# Patient Record
Sex: Female | Born: 2000 | Race: White | Hispanic: No | Marital: Single | Smoking: Never smoker
Health system: Southern US, Community
[De-identification: ages and names within clinical notes are randomized; demographics above are authoritative.]

## PROBLEM LIST (undated history)

## (undated) DIAGNOSIS — J302 Other seasonal allergic rhinitis: Secondary | ICD-10-CM

## (undated) DIAGNOSIS — I498 Other specified cardiac arrhythmias: Secondary | ICD-10-CM

## (undated) DIAGNOSIS — N200 Calculus of kidney: Secondary | ICD-10-CM

## (undated) DIAGNOSIS — G90A Postural orthostatic tachycardia syndrome (POTS): Secondary | ICD-10-CM

## (undated) HISTORY — DX: Other seasonal allergic rhinitis: J30.2

## (undated) HISTORY — PX: LITHOTRIPSY: SUR834

---
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
## 2019-10-03 ENCOUNTER — Other Ambulatory Visit: Payer: Self-pay

## 2019-10-03 ENCOUNTER — Ambulatory Visit: Payer: Self-pay | Attending: Internal Medicine

## 2019-10-03 DIAGNOSIS — Z23 Encounter for immunization: Secondary | ICD-10-CM

## 2019-10-03 NOTE — Progress Notes (Signed)
   Covid-19 Vaccination Clinic  Name:  Erica Sweeney    MRN: [id number] DOB: 2000-07-23  10/03/2019  Erica Sweeney was observed post Covid-19 immunization for 15 minutes without incident. She was provided with Vaccine Information Sheet and instruction to access the V-Safe system.   Erica Sweeney was instructed to call 911 with any severe reactions post vaccine: Marland Kitchen Difficulty breathing  . Swelling of face and throat  . A fast heartbeat  . A bad rash all over body  . Dizziness and weakness   Immunizations Administered    Name Date Dose VIS Date Route   Pfizer COVID-19 Vaccine 10/03/2019  4:54 PM 0.3 mL 06/17/2019 Intramuscular   Manufacturer: [employer], [employer]   Lot: [id number]   NDC: [address]-5

## 2019-10-17 ENCOUNTER — Ambulatory Visit
Admission: RE | Admit: 2019-10-17 | Discharge: 2019-10-17 | Disposition: A | Discharge status: Home / Self Care | Payer: 59 | Source: Ambulatory Visit | Attending: Family Medicine | Admitting: Family Medicine

## 2019-10-17 ENCOUNTER — Other Ambulatory Visit: Payer: Self-pay | Admitting: Family Medicine

## 2019-10-17 DIAGNOSIS — N218 Other lower urinary tract calculus: Secondary | ICD-10-CM

## 2019-10-17 DIAGNOSIS — R109 Unspecified abdominal pain: Secondary | ICD-10-CM

## 2019-10-24 ENCOUNTER — Ambulatory Visit: Payer: 59 | Attending: Internal Medicine

## 2019-10-24 DIAGNOSIS — Z23 Encounter for immunization: Secondary | ICD-10-CM

## 2019-10-24 NOTE — Progress Notes (Signed)
   Covid-19 Vaccination Clinic  Name:  Erica Sweeney    MRN: [id number] DOB: 25-Mar-2001  10/24/2019  [name] was observed post Covid-19 immunization for 30 minutes based on pre-vaccination screening without incident. She was provided with Vaccine Information Sheet and instruction to access the V-Safe system.   [name] was instructed to call 911 with any severe reactions post vaccine: Marland Kitchen Difficulty breathing  . Swelling of face and throat  . A fast heartbeat  . A bad rash all over body  . Dizziness and weakness   Immunizations Administered    Name Date Dose VIS Date Route   Pfizer COVID-19 Vaccine 10/24/2019  4:15 PM 0.3 mL 08/31/2018 Intramuscular   Manufacturer: [employer], [employer]   Lot: [id number]   NDC: [address]-8

## 2019-10-30 ENCOUNTER — Emergency Department
Admission: EM | Admit: 2019-10-30 | Discharge: 2019-10-30 | Disposition: A | Discharge status: Home / Self Care | Payer: 59 | Attending: Emergency Medicine | Admitting: Emergency Medicine

## 2019-10-30 ENCOUNTER — Emergency Department: Payer: 59

## 2019-10-30 ENCOUNTER — Encounter: Payer: Self-pay | Admitting: Emergency Medicine

## 2019-10-30 ENCOUNTER — Other Ambulatory Visit: Payer: Self-pay

## 2019-10-30 DIAGNOSIS — N200 Calculus of kidney: Secondary | ICD-10-CM | POA: Diagnosis not present

## 2019-10-30 DIAGNOSIS — R109 Unspecified abdominal pain: Secondary | ICD-10-CM | POA: Diagnosis present

## 2019-10-30 DIAGNOSIS — R1030 Lower abdominal pain, unspecified: Secondary | ICD-10-CM | POA: Diagnosis not present

## 2019-10-30 HISTORY — DX: Postural orthostatic tachycardia syndrome (POTS): G90.A

## 2019-10-30 HISTORY — DX: Calculus of kidney: N20.0

## 2019-10-30 HISTORY — DX: Other specified cardiac arrhythmias: I49.8

## 2019-10-30 LAB — CBC
HCT: 40.1 % (ref 36.0–46.0)
Hemoglobin: 13.9 g/dL (ref 12.0–15.0)
MCH: 31.7 pg (ref 26.0–34.0)
MCHC: 34.7 g/dL (ref 30.0–36.0)
MCV: 91.6 fL (ref 80.0–100.0)
Platelets: 300 10*3/uL (ref 150–400)
RBC: 4.38 MIL/uL (ref 3.87–5.11)
RDW: 11.9 % (ref 11.5–15.5)
WBC: 8.6 10*3/uL (ref 4.0–10.5)
nRBC: 0 % (ref 0.0–0.2)

## 2019-10-30 LAB — URINALYSIS, COMPLETE (UACMP) WITH MICROSCOPIC
Bacteria, UA: NONE SEEN
Bilirubin Urine: NEGATIVE
Glucose, UA: NEGATIVE mg/dL
Ketones, ur: NEGATIVE mg/dL
Nitrite: NEGATIVE
Protein, ur: NEGATIVE mg/dL
RBC / HPF: >50 RBC/hpf (ref 0–5)
Specific Gravity, Urine: 1.02 (ref 1.005–1.030)
pH: 7 (ref 5.0–8.0)

## 2019-10-30 LAB — COMPREHENSIVE METABOLIC PANEL
ALT: 19 U/L (ref 0–44)
AST: 22 U/L (ref 15–41)
Albumin: 4.5 g/dL (ref 3.5–5.0)
Alkaline Phosphatase: 59 U/L (ref 38–126)
Anion gap: 9 (ref 5–15)
BUN: 11 mg/dL (ref 6–20)
CO2: 25 mmol/L (ref 22–32)
Calcium: 9.5 mg/dL (ref 8.9–10.3)
Chloride: 103 mmol/L (ref 98–111)
Creatinine, Ser: 0.71 mg/dL (ref 0.44–1.00)
GFR calc Af Amer: >60 mL/min (ref >60)
GFR calc non Af Amer: >60 mL/min (ref >60)
Glucose, Bld: 98 mg/dL (ref 70–99)
Potassium: 3.8 mmol/L (ref 3.5–5.1)
Sodium: 137 mmol/L (ref 135–145)
Total Bilirubin: 0.7 mg/dL (ref 0.3–1.2)
Total Protein: 8 g/dL (ref 6.5–8.1)

## 2019-10-30 LAB — WET PREP, GENITAL
Clue Cells Wet Prep HPF POC: NONE SEEN
Sperm: NONE SEEN
Trich, Wet Prep: NONE SEEN
Yeast Wet Prep HPF POC: NONE SEEN

## 2019-10-30 LAB — CHLAMYDIA/NGC RT PCR (ARMC ONLY)
Chlamydia Tr: NOT DETECTED
N gonorrhoeae: NOT DETECTED

## 2019-10-30 LAB — LIPASE, BLOOD: Lipase: 21 U/L (ref 11–51)

## 2019-10-30 LAB — POCT PREGNANCY, URINE: Preg Test, Ur: NEGATIVE

## 2019-10-30 NOTE — ED Triage Notes (Signed)
Pt here for abdominal pain.  Pain moves all over stomach and has been present X 1 month. Hx of kidney stones; had negative KUB through [facility]. + diarrhea and nausea.  Reports hematuria.  Given abx for UTI from UC 5 days ago but not getting better. Today pain is both flank and radiates around to abdomen.

## 2019-10-30 NOTE — Discharge Instructions (Addendum)
Follow-up with your regular doctor if not improving in 3 days.  Return the emergency department if worsening.  Take Tylenol and ibuprofen for pain as needed.  Drink plenty of water.  If your STD test is positive you will need treatment.  You may either return here, go to the [facility], or go to the [facility] health department.

## 2019-10-30 NOTE — ED Provider Notes (Signed)
[facility] Emergency Department Provider Note  ____________________________________________   First MD Initiated Contact with Patient 10/30/19 1704     (approximate)  I have reviewed the triage vital signs and the nursing notes.   HISTORY  Chief Complaint Abdominal Pain and Flank Pain    HPI Erica Sweeney is a 19 y.o. female presents emergency department complaining abdominal pain on and off for 1 month.  History of kidney stones.  Had a negative KUB [place], she has had some diarrhea and nausea.  Some hematuria but is also currently on her period, she was given a antibiotic from the urgent care 5 days ago but feels like she is not getting better.  Flank pain is radiating around to the abdomen.  Also the patient had sex for the first time a few days ago.  She continues to have some burning with urination and itching in the vaginal area.  No vaginal discharge.  She states he did use a condom.    Past Medical History:  Diagnosis Date   Kidney stone    POTS (postural orthostatic tachycardia syndrome)     There are no problems to display for this patient.   Past Surgical History:  Procedure Laterality Date   LITHOTRIPSY      Prior to Admission medications   Not on File    Allergies Morphine and related  History reviewed. No pertinent family history.  Social History Social History   Tobacco Use   Smoking status: Never Smoker   Smokeless tobacco: Never Used  Substance Use Topics   Alcohol use: Yes   Drug use: Never    Review of Systems  Constitutional: No fever/chills Eyes: No visual changes. ENT: No sore throat. Respiratory: Denies cough Cardiovascular: Denies chest pain Gastrointestinal: Positive abdominal pain Genitourinary: Positive for dysuria. Musculoskeletal: Negative for back pain. Skin: Negative for rash. Psychiatric: no mood changes,     ____________________________________________   PHYSICAL EXAM:  VITAL  SIGNS: ED Triage Vitals  Enc Vitals Group     BP 10/30/19 1620 119/69     Pulse Rate 10/30/19 1620 85     Resp 10/30/19 1620 14     Temp 10/30/19 1620 98.6 F (37 C)     Temp Source 10/30/19 1620 Oral     SpO2 10/30/19 1620 99 %     Weight 10/30/19 1615 125 lb (56.7 kg)     Height 10/30/19 1615 5\' 6"  (1.676 m)     Head Circumference --      Peak Flow --      Pain Score --      Pain Loc --      Pain Edu? --      Excl. in [place]? --     Constitutional: Alert and oriented. Well appearing and in no acute distress. Eyes: Conjunctivae are normal.  Head: Atraumatic. Nose: No congestion/rhinnorhea. Mouth/Throat: Mucous membranes are moist.   Neck:  supple no lymphadenopathy noted Cardiovascular: Normal rate, regular rhythm. Heart sounds are normal Respiratory: Normal respiratory effort.  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  Skin:  Skin is warm, dry and intact. No rash noted.  Psychiatric: Mood and affect are normal. Speech and behavior are normal.  ____________________________________________   LABS (all labs ordered are listed, but only abnormal results are displayed)  Labs Reviewed  WET PREP, GENITAL - Abnormal; Notable for the following components:      Result Value   WBC, Wet Prep HPF POC FEW (*)    All other components within normal limits  URINALYSIS, COMPLETE (UACMP) WITH MICROSCOPIC - Abnormal; Notable for the following components:   Color, Urine AMBER (*)    APPearance HAZY (*)    Hgb urine dipstick LARGE (*)    Leukocytes,Ua TRACE (*)    All other components within normal limits  CHLAMYDIA/NGC RT PCR ([facility]  ONLY)  URINE CULTURE  LIPASE, BLOOD  COMPREHENSIVE METABOLIC PANEL  CBC  POC URINE PREG, ED  POCT PREGNANCY, URINE   ____________________________________________   ____________________________________________  RADIOLOGY  KUB shows a kidney stone at the right renal silhouette  ____________________________________________   PROCEDURES  Procedure(s) performed: No  Procedures    ____________________________________________   INITIAL IMPRESSION / ASSESSMENT AND PLAN / ED COURSE  Pertinent labs & imaging results that were available during my care of the patient were reviewed by me and considered in my medical decision making (see chart for details).   Patient is 19 year old female presents emergency department with right-sided flank pain.  See HPI  Physical exam shows patient to appear well.  Abdomen is nontender.  Pelvic exam shows some bruising in the vaginal vault, patient is currently on her period, no vaginal discharge noted.  DDx: Kidney stone, appendicitis, UTI, STI  CBC is normal, comprehensive metabolic panel is normal, lipase normal, urinalysis has large amount of hemoglobin with trace of leuks, 11-20 WBCs greater than 50 RBCs but.  Patient is currently on her cycle.  POC pregnancy is negative, GC/chlamydia is negative, wet prep has a few WBCs, I did order a urine culture due to the recent antibiotic use along with continued dysuria  I feel that all the labs are reassuring.  The patient is not tender in the right lower quadrant for concerns of appendicitis.  KUB does show a kidney stone along the renal silhouette.  I feel the patient has more anxiety towards her abdominal pain at this time.  Most likely due to her first sexual experience.  I did have a long discussion with her about the use of condoms.  I feel that she should follow-up with a GYN doctor for a pelvic when she is not on her cycle.  She states she understands and will comply.  She is to return  the emergency department if worsening.  She was discharged in stable condition.    Erica Sweeney was evaluated in Emergency Department on 10/30/2019 for the symptoms described in the history of present illness. She was evaluated in the context of the global COVID-19 pandemic, which necessitated consideration that the patient might be at risk for infection with the SARS-CoV-2 virus that causes COVID-19. Institutional protocols and algorithms that pertain to the evaluation of patients at risk for COVID-19 are in a state of rapid change based on information released by regulatory bodies including the CDC and federal and state organizations. These policies and algorithms were followed during the patient's care in the ED.   As part of my medical decision making, I reviewed the following data within the electronic MEDICAL RECORD NUMBER Nursing notes reviewed and incorporated, Labs reviewed , Old chart reviewed, Radiograph reviewed , Notes from prior ED visits and Impact  Controlled Substance Database  ____________________________________________   FINAL CLINICAL IMPRESSION(S) / ED DIAGNOSES  Final diagnoses:  Kidney stone on right side  Lower abdominal pain      NEW MEDICATIONS STARTED DURING THIS VISIT:  New Prescriptions   No medications on file     Note:  This document was prepared using Dragon voice recognition software and may include unintentional dictation errors.    Versie Starks, PA-C 10/30/19 2003    Earleen Newport, MD 10/30/19 2157

## 2019-10-31 LAB — URINE CULTURE: Culture: <10000 — AB

## 2020-09-29 ENCOUNTER — Emergency Department
Admission: EM | Admit: 2020-09-29 | Discharge: 2020-09-29 | Disposition: A | Discharge status: Home / Self Care | Payer: 59 | Attending: Emergency Medicine | Admitting: Emergency Medicine

## 2020-09-29 ENCOUNTER — Other Ambulatory Visit: Payer: Self-pay

## 2020-09-29 DIAGNOSIS — L03115 Cellulitis of right lower limb: Secondary | ICD-10-CM

## 2020-09-29 DIAGNOSIS — X58XXXA Exposure to other specified factors, initial encounter: Secondary | ICD-10-CM | POA: Insufficient documentation

## 2020-09-29 DIAGNOSIS — S8991XA Unspecified injury of right lower leg, initial encounter: Secondary | ICD-10-CM | POA: Diagnosis present

## 2020-09-29 DIAGNOSIS — S80821A Blister (nonthermal), right lower leg, initial encounter: Secondary | ICD-10-CM | POA: Insufficient documentation

## 2020-09-29 DIAGNOSIS — T148XXA Other injury of unspecified body region, initial encounter: Secondary | ICD-10-CM

## 2020-09-29 MED ORDER — BACITRACIN-NEOMYCIN-POLYMYXIN 400-5-5000 EX OINT
TOPICAL_OINTMENT | Freq: Once | CUTANEOUS | Status: AC
  Administered 2020-09-29: 1 via TOPICAL
  Filled 2020-09-29: qty 1

## 2020-09-29 MED ORDER — CEPHALEXIN 500 MG PO CAPS: 500.0000 mg | ORAL_CAPSULE | Freq: Three times a day (TID) | ORAL | 0 refills | Status: AC

## 2020-09-29 NOTE — ED Triage Notes (Signed)
First Nurse Note:  patient had blister to heel yesterday.  Today awoke with area reddened and ankle swelling noted.  AAOx3.  Skin warm and dry. NAD. Ambulates with easy and steady gait.

## 2020-09-29 NOTE — ED Provider Notes (Signed)
[facility] Emergency Department Provider Note  ____________________________________________   Event Date/Time   First MD Initiated Contact with Patient 09/29/20 1440     (approximate)  I have reviewed the triage vital signs and the nursing notes.   HISTORY  Chief Complaint Foot Pain    HPI Erica Sweeney is a 20 y.o. female presents emergency department complaining of a blister on the back of the heel that has become much larger and has redness surrounding the area.  Patient states that she gets blisters from running but this is much worse than her normal.  She had called her mother and her mother was concerned due to the appearance of the blister.  No fever or chills.  No drainage from the area.    Past Medical History:  Diagnosis Date  . Kidney stone   . POTS (postural orthostatic tachycardia syndrome)     There are no problems to display for this patient.   Past Surgical History:  Procedure Laterality Date  . LITHOTRIPSY      Prior to Admission medications   Medication Sig Start Date End Date Taking? Authorizing Provider  cephALEXin (KEFLEX) 500 MG capsule Take 1 capsule (500 mg total) by mouth 3 (three) times daily for 7 days. 09/29/20 10/06/20 Yes Fisher, Roselyn Bering, PA-C    Allergies Morphine and related  History reviewed. No pertinent family history.  Social History Social History   Tobacco Use  . Smoking status: Never Smoker  . Smokeless tobacco: Never Used  Substance Use Topics  . Alcohol use: Yes  . Drug use: Never    Review of Systems  Constitutional: No fever/chills Eyes: No visual changes. ENT: No sore throat. Respiratory: Denies cough Genitourinary: Negative for dysuria. Musculoskeletal: Negative for back pain.  Positive for blister on the right heel Skin: Negative for rash. Psychiatric: no mood changes,     ____________________________________________   PHYSICAL EXAM:  VITAL SIGNS: ED Triage Vitals  Enc  Vitals Group     BP 09/29/20 1354 122/80     Pulse Rate 09/29/20 1354 92     Resp 09/29/20 1354 16     Temp 09/29/20 1354 98.2 F (36.8 C)     Temp Source 09/29/20 1354 Oral     SpO2 09/29/20 1354 100 %     Weight 09/29/20 1352 125 lb (56.7 kg)     Height 09/29/20 1352 5\' 6"  (1.676 m)     Head Circumference --      Peak Flow --      Pain Score 09/29/20 1351 6     Pain Loc --      Pain Edu? --      Excl. in GC? --     Constitutional: Alert and oriented. Well appearing and in no acute distress. Eyes: Conjunctivae are normal.  Head: Atraumatic. Nose: No congestion/rhinnorhea. Mouth/Throat: Mucous membranes are moist.   Neck:  supple no lymphadenopathy noted Cardiovascular: Normal rate, regular rhythm.  Respiratory: Normal respiratory effort.  No retractions, GU: deferred Musculoskeletal: FROM all extremities, warm and well perfused, skin on achilles with large blister, swelling, and redness extending out approx by 1 inch Neurologic:  Normal speech and language.  Skin:  Skin is warm, dry and intact.  Psychiatric: Mood and affect are normal. Speech and behavior are normal.  ____________________________________________   LABS (all labs ordered are listed, but only abnormal results are displayed)  Labs Reviewed - No data to display ____________________________________________   ____________________________________________  RADIOLOGY  ____________________________________________   PROCEDURES  Procedure(s) performed: No  Procedures    ____________________________________________   INITIAL IMPRESSION / ASSESSMENT AND PLAN / ED COURSE  Pertinent labs & imaging results that were available during my care of the patient were reviewed by me and considered in my medical decision making (see chart for details).   Patient is 20 year old female presents with blister to the right ankle.  See HPI.  Physical exam shows patient appear well.  In discussion with the  patient with concerns of infection that close to the Achilles, placed her on antibiotic.  She is to leave the blister intact.  She is not to wear any shoes that have enclosed heels.  Follow-up with your regular doctor in the acute care if not improving to 3 days.  Return emergency department worsening.  She is discharged stable condition.     Erica Sweeney was evaluated in Emergency Department on 09/29/2020 for the symptoms described in the history of present illness. She was evaluated in the context of the global COVID-19 pandemic, which necessitated consideration that the patient might be at risk for infection with the SARS-CoV-2 virus that causes COVID-19. Institutional protocols and algorithms that pertain to the evaluation of patients at risk for COVID-19 are in a state of rapid change based on information released by regulatory bodies including the CDC and federal and state organizations. These policies and algorithms were followed during the patient's care in the ED.    As part of my medical decision making, I reviewed the following data within the electronic MEDICAL RECORD NUMBER Nursing notes reviewed and incorporated, Old chart reviewed, Notes from prior ED visits and [place] Controlled Substance Database  ____________________________________________   FINAL CLINICAL IMPRESSION(S) / ED DIAGNOSES  Final diagnoses:  Blister  Cellulitis of right lower extremity      NEW MEDICATIONS STARTED DURING THIS VISIT:  Discharge Medication List as of 09/29/2020  3:04 PM    START taking these medications   Details  cephALEXin (KEFLEX) 500 MG capsule Take 1 capsule (500 mg total) by mouth 3 (three) times daily for 7 days., Starting Sat 09/29/2020, Until Sat 10/06/2020, Normal         Note:  This document was prepared using Dragon voice recognition software and may include unintentional dictation errors.    Faythe Ghee, PA-C 09/29/20 1624    Sharyn Creamer, MD 09/29/20 [phone]

## 2020-09-29 NOTE — Discharge Instructions (Addendum)
Wear open heeled shoes.  Do not have anything that would rub against the blister.  Soak the foot in warm water with Epson salts at least once daily for about 10 minutes. Do not pop the blister.  This is a natural bandage.  Return if worsening

## 2020-09-29 NOTE — ED Triage Notes (Signed)
Pt has white area on back of heel surrounded by some redness and pain that started as a blister.

## 2021-07-02 DIAGNOSIS — G43719 Chronic migraine without aura, intractable, without status migrainosus: Secondary | ICD-10-CM | POA: Insufficient documentation

## 2021-07-10 ENCOUNTER — Other Ambulatory Visit: Payer: Self-pay

## 2021-07-10 ENCOUNTER — Ambulatory Visit
Admission: RE | Admit: 2021-07-10 | Discharge: 2021-07-10 | Disposition: A | Discharge status: Home / Self Care | Payer: 59 | Source: Ambulatory Visit | Attending: Orthopedic Surgery | Admitting: Orthopedic Surgery

## 2021-07-10 ENCOUNTER — Other Ambulatory Visit: Payer: Self-pay | Admitting: Orthopedic Surgery

## 2021-07-10 ENCOUNTER — Ambulatory Visit
Admission: RE | Admit: 2021-07-10 | Discharge: 2021-07-10 | Disposition: A | Discharge status: Home / Self Care | Payer: 59 | Attending: Orthopedic Surgery | Admitting: Orthopedic Surgery

## 2021-07-10 DIAGNOSIS — R52 Pain, unspecified: Secondary | ICD-10-CM | POA: Insufficient documentation

## 2021-07-30 DIAGNOSIS — J45909 Unspecified asthma, uncomplicated: Secondary | ICD-10-CM | POA: Insufficient documentation

## 2021-07-30 DIAGNOSIS — E282 Polycystic ovarian syndrome: Secondary | ICD-10-CM | POA: Insufficient documentation

## 2021-07-30 DIAGNOSIS — G90A Postural orthostatic tachycardia syndrome (POTS): Secondary | ICD-10-CM | POA: Insufficient documentation

## 2021-07-30 DIAGNOSIS — N200 Calculus of kidney: Secondary | ICD-10-CM | POA: Insufficient documentation

## 2021-07-30 DIAGNOSIS — I7300 Raynaud's syndrome without gangrene: Secondary | ICD-10-CM | POA: Insufficient documentation

## 2021-08-13 ENCOUNTER — Other Ambulatory Visit: Payer: Self-pay

## 2021-08-13 ENCOUNTER — Emergency Department
Admission: EM | Admit: 2021-08-13 | Discharge: 2021-08-13 | Disposition: A | Discharge status: Home / Self Care | Payer: 59 | Attending: Emergency Medicine | Admitting: Emergency Medicine

## 2021-08-13 DIAGNOSIS — R112 Nausea with vomiting, unspecified: Secondary | ICD-10-CM | POA: Insufficient documentation

## 2021-08-13 DIAGNOSIS — R1012 Left upper quadrant pain: Secondary | ICD-10-CM | POA: Diagnosis not present

## 2021-08-13 DIAGNOSIS — R109 Unspecified abdominal pain: Secondary | ICD-10-CM | POA: Diagnosis present

## 2021-08-13 LAB — URINALYSIS, ROUTINE W REFLEX MICROSCOPIC
Bilirubin Urine: NEGATIVE
Glucose, UA: NEGATIVE mg/dL
Ketones, ur: NEGATIVE mg/dL
Nitrite: NEGATIVE
Protein, ur: NEGATIVE mg/dL
Specific Gravity, Urine: 1.017 (ref 1.005–1.030)
pH: 5 (ref 5.0–8.0)

## 2021-08-13 LAB — COMPREHENSIVE METABOLIC PANEL
ALT: 18 U/L (ref 0–44)
AST: 23 U/L (ref 15–41)
Albumin: 4 g/dL (ref 3.5–5.0)
Alkaline Phosphatase: 65 U/L (ref 38–126)
Anion gap: 7 (ref 5–15)
BUN: 13 mg/dL (ref 6–20)
CO2: 25 mmol/L (ref 22–32)
Calcium: 9.5 mg/dL (ref 8.9–10.3)
Chloride: 103 mmol/L (ref 98–111)
Creatinine, Ser: 0.72 mg/dL (ref 0.44–1.00)
GFR, Estimated: >60 mL/min (ref >60)
Glucose, Bld: 106 mg/dL — ABNORMAL HIGH (ref 70–99)
Potassium: 3.2 mmol/L — ABNORMAL LOW (ref 3.5–5.1)
Sodium: 135 mmol/L (ref 135–145)
Total Bilirubin: 0.9 mg/dL (ref 0.3–1.2)
Total Protein: 7.3 g/dL (ref 6.5–8.1)

## 2021-08-13 LAB — CBC
HCT: 44.9 % (ref 36.0–46.0)
Hemoglobin: 14.9 g/dL (ref 12.0–15.0)
MCH: 31 pg (ref 26.0–34.0)
MCHC: 33.2 g/dL (ref 30.0–36.0)
MCV: 93.3 fL (ref 80.0–100.0)
Platelets: 304 10*3/uL (ref 150–400)
RBC: 4.81 MIL/uL (ref 3.87–5.11)
RDW: 12.2 % (ref 11.5–15.5)
WBC: 6.3 10*3/uL (ref 4.0–10.5)
nRBC: 0 % (ref 0.0–0.2)

## 2021-08-13 LAB — POC URINE PREG, ED: Preg Test, Ur: NEGATIVE

## 2021-08-13 LAB — LIPASE, BLOOD: Lipase: 33 U/L (ref 11–51)

## 2021-08-13 MED ORDER — ONDANSETRON 8 MG PO TBDP
8.0000 mg | ORAL_TABLET | Freq: Once | ORAL | Status: AC
  Administered 2021-08-13: 8 mg via ORAL
  Filled 2021-08-13: qty 1

## 2021-08-13 MED ORDER — ONDANSETRON 4 MG PO TBDP: 4.0000 mg | ORAL_TABLET | Freq: Three times a day (TID) | ORAL | 0 refills | Status: DC | PRN

## 2021-08-13 MED ORDER — FAMOTIDINE 20 MG PO TABS
40.0000 mg | ORAL_TABLET | Freq: Once | ORAL | Status: AC
  Administered 2021-08-13: 40 mg via ORAL
  Filled 2021-08-13: qty 2

## 2021-08-13 MED ORDER — ALUMINUM-MAGNESIUM-SIMETHICONE 200-200-20 MG/5ML PO SUSP: 30.0000 mL | Freq: Three times a day (TID) | ORAL | 0 refills | Status: DC

## 2021-08-13 MED ORDER — ALUM & MAG HYDROXIDE-SIMETH 200-200-20 MG/5ML PO SUSP
30.0000 mL | Freq: Once | ORAL | Status: AC
  Administered 2021-08-13: 30 mL via ORAL
  Filled 2021-08-13: qty 30

## 2021-08-13 MED ORDER — FAMOTIDINE 20 MG PO TABS: 20.0000 mg | ORAL_TABLET | Freq: Two times a day (BID) | ORAL | 0 refills | Status: DC

## 2021-08-13 NOTE — ED Triage Notes (Signed)
Pt has abd pain with n/v.  Hx kidney stones.  Pt reports pain above navel and right side of abd.  No vag bleeding.  No dysuria.  Pt alert  speech clear.

## 2021-08-13 NOTE — ED Provider Notes (Signed)
[facility] Provider Note    Event Date/Time   First MD Initiated Contact with Patient 08/13/21 2133     (approximate)   History   Flank Pain and Abdominal Pain   HPI  Jennesis Kohlmann is a 21 y.o. female with a past history of kidney stones and ovarian cyst who comes the ED complaining of abdominal pain with nausea and vomiting that started yesterday evening, gradual onset.  Today with eating she had stomach upset, and tonight after eating a slice of pizza she had vomiting.  No constipation diarrhea or fever.  Pain is moderate intensity.     Physical Exam   Triage Vital Signs: ED Triage Vitals  Enc Vitals Group     BP 08/13/21 2001 117/81     Pulse Rate 08/13/21 2001 (!) 112     Resp 08/13/21 2001 20     Temp 08/13/21 2001 98.5 F (36.9 C)     Temp Source 08/13/21 2001 Oral     SpO2 08/13/21 2001 96 %     Weight 08/13/21 2002 125 lb (56.7 kg)     Height 08/13/21 2002 5\' 6"  (1.676 m)     Head Circumference --      Peak Flow --      Pain Score 08/13/21 2002 6     Pain Loc --      Pain Edu? --      Excl. in [place]? --     Most recent vital signs: Vitals:   08/13/21 2001  BP: 117/81  Pulse: (!) 112  Resp: 20  Temp: 98.5 F (36.9 C)  SpO2: 96%     General: Awake, no distress.  CV:  Good peripheral perfusion.  Resp:  Normal effort.  Abd:  No distention.  Soft, no peritoneal signs.  There is left upper quadrant  tenderness.  No right-sided tenderness, negative Murphy sign.  No right lower quadrant tenderness or tenderness at McBurney's point.  No CVA tenderness Other:  No rash.  Moist mucosa   ED Results / Procedures / Treatments   Labs (all labs ordered are listed, but only abnormal results are displayed) Labs Reviewed  COMPREHENSIVE METABOLIC PANEL - Abnormal; Notable for the following components:      Result Value   Potassium 3.2 (*)    Glucose, Bld 106 (*)    All other components within normal limits  URINALYSIS, ROUTINE W REFLEX  MICROSCOPIC - Abnormal; Notable for the following components:   Color, Urine YELLOW (*)    APPearance HAZY (*)    Hgb urine dipstick MODERATE (*)    Leukocytes,Ua SMALL (*)    Bacteria, UA RARE (*)    All other components within normal limits  LIPASE, BLOOD  CBC  POC URINE PREG, ED     EKG     RADIOLOGY     PROCEDURES:  Critical Care performed: No  Procedures   MEDICATIONS ORDERED IN ED: Medications  ondansetron (ZOFRAN-ODT) disintegrating tablet 8 mg (has no administration in time range)  alum & mag hydroxide-simeth (MAALOX/MYLANTA) 200-200-20 MG/5ML suspension 30 mL (has no administration in time range)  famotidine (PEPCID) tablet 40 mg (has no administration in time range)     IMPRESSION / MDM / ASSESSMENT AND PLAN / ED COURSE  I reviewed the triage vital signs and the nursing notes.  Differential diagnosis includes, but is not limited to, viral illness, GERD, kidney stone, ovarian cyst    Patient presents with reported right-sided abdominal pain.  Vital signs are normal.  Not tachycardic on my exam despite triage vitals.  Labs are essentially normal.  Exam is also benign without any right-sided tenderness.  She does have exam findings suggestive of gastritis/GERD.    No signs of UTI.  Creatinine is normal.  Pain is not typical for renal colic, doubt ureteral obstruction.  Doubt appendicitis cholecystitis pancreatitis bowel obstruction diverticulitis or bowel perforation.  No hernia.  Doubt torsion, STI PID TOA.  Pregnancy negative.  We will treat with antacids, follow-up with primary care.  Return precautions discussed     FINAL CLINICAL IMPRESSION(S) / ED DIAGNOSES   Final diagnoses:  Abdominal pain, unspecified abdominal location     Rx / DC Orders   ED Discharge Orders          Ordered    ondansetron (ZOFRAN-ODT) 4 MG disintegrating tablet  Every 8 hours PRN        08/13/21 2214    famotidine (PEPCID) 20 MG  tablet  2 times daily        08/13/21 2214    aluminum-magnesium hydroxide-simethicone (MAALOX) 200-200-20 MG/5ML SUSP  3 times daily before meals & bedtime        08/13/21 2214             Note:  This document was prepared using Dragon voice recognition software and may include unintentional dictation errors.   Carrie Mew, MD 08/13/21 2220

## 2021-10-28 IMAGING — DX DG ABDOMEN 1V
1 series · 1 of 1 positions shown · non-contrast
Comparison: 10/17/2019

CLINICAL DATA: Intermittent periumbilical abdominal pain for 1
month history of renal calculi

EXAM:
ABDOMEN - 1 VIEW

[abdomen supine]
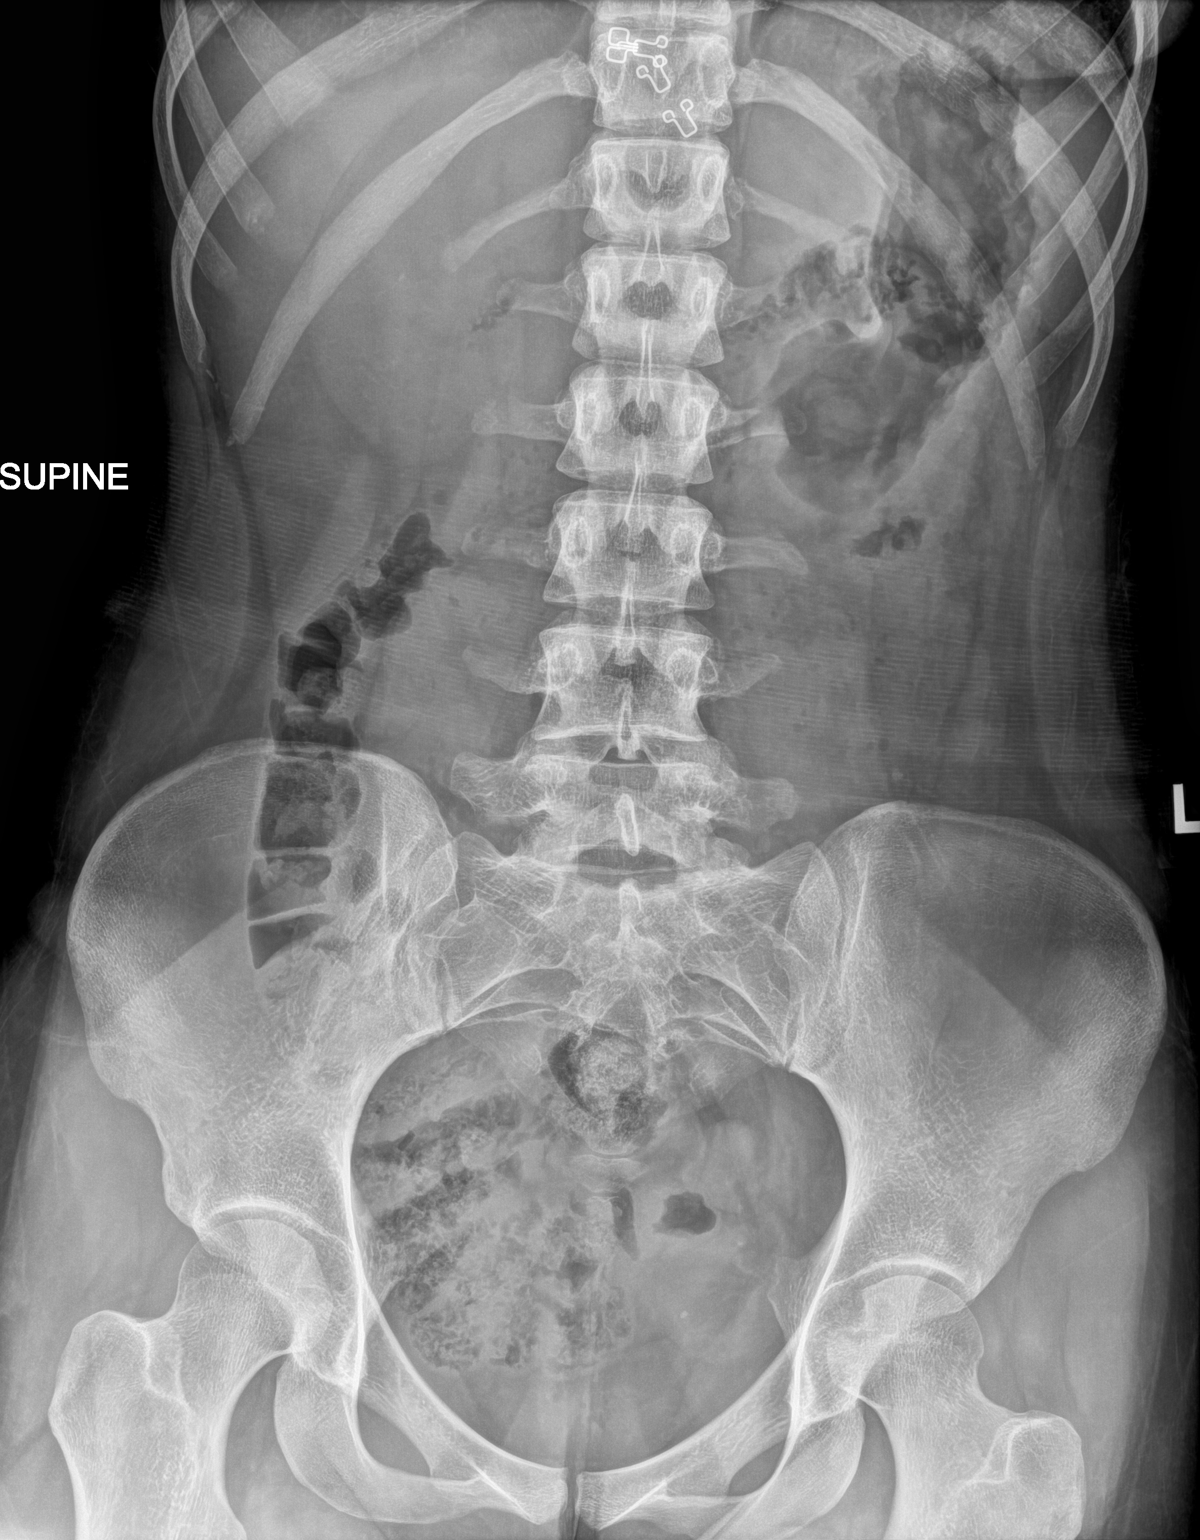

[1 of 1 positions shown; findings below may reference images not displayed]

FINDINGS: Supine frontal view of the abdomen and pelvis excludes the
hemidiaphragms by collimation. No bowel obstruction or ileus.
Minimal retained stool throughout the colon. 2 mm calculus overlying
right renal silhouette consistent with nephrolithiasis. Stable
phleboliths within the pelvis. No acute bony abnormalities.
IMPRESSION: 1. Punctate right nephrolithiasis.
2. Unremarkable bowel gas pattern.

## 2022-03-06 ENCOUNTER — Encounter: Payer: Self-pay | Admitting: Family Medicine

## 2022-03-06 ENCOUNTER — Ambulatory Visit (INDEPENDENT_AMBULATORY_CARE_PROVIDER_SITE_OTHER): Payer: 59 | Admitting: Family Medicine

## 2022-03-06 ENCOUNTER — Other Ambulatory Visit: Payer: Self-pay | Admitting: *Deleted

## 2022-03-06 ENCOUNTER — Other Ambulatory Visit: Payer: Self-pay

## 2022-03-06 VITALS — BP 122/66 | HR 101 | Temp 97.1°F | Resp 18 | Ht 65.5 in | Wt 125.0 lb

## 2022-03-06 DIAGNOSIS — H1031 Unspecified acute conjunctivitis, right eye: Secondary | ICD-10-CM | POA: Diagnosis not present

## 2022-03-06 MED ORDER — GENTAMICIN SULFATE 0.3 % OP SOLN
1.0000 [drp] | OPHTHALMIC | 0 refills | Status: DC
Start: 2022-03-06 — End: 2022-03-18

## 2022-03-06 NOTE — Progress Notes (Signed)
[facility] [address] [place], [place] [address] Phone: [phone] Fax: [phone]   Office Visit Note  Patient Name: Erica Sweeney  Date of Birth:[birth year]  Med Rec number [id number]  Date of Service: 03/06/2022  Morphine and related  Chief Complaint  Patient presents with   Conjunctivitis     Was putting eye cream on last night and got some in right eye Irritated at the time but nothing significant Woke up at 3 am with painful eye - lots of discharge with lids stuck together this morning and getting discharge through the day No photophobia Has also had allergy symptoms recently - took zyrtec today which helps   Conjunctivitis  Associated symptoms include eye discharge, eye pain and eye redness. Pertinent negatives include no photophobia and no cough.        Current Medication:  Outpatient Encounter Medications as of 03/06/2022  Medication Sig   spironolactone (ALDACTONE) 25 MG tablet Take 25 mg by mouth daily.   [DISCONTINUED] aluminum-magnesium hydroxide-simethicone (MAALOX) 200-200-20 MG/5ML SUSP Take 30 mLs by mouth 4 (four) times daily -  before meals and at bedtime.   [DISCONTINUED] famotidine (PEPCID) 20 MG tablet Take 1 tablet (20 mg total) by mouth 2 (two) times daily.   [DISCONTINUED] ondansetron (ZOFRAN-ODT) 4 MG disintegrating tablet Take 1 tablet (4 mg total) by mouth every 8 (eight) hours as needed for nausea or vomiting.   No facility-administered encounter medications on file as of 03/06/2022.      Medical History: Past Medical History:  Diagnosis Date   Kidney stone    POTS (postural orthostatic tachycardia syndrome)      Vital Signs: BP 122/66   Pulse (!) 101   Temp (!) 97.1 F (36.2 C) (Tympanic)   Resp 18   Ht 5' 5.5" (1.664 m)   Wt 125 lb (56.7 kg)   SpO2 99%   BMI 20.48 kg/m    Review of Systems  Constitutional: Negative.   HENT:  Positive for postnasal drip.   Eyes:  Positive for pain, discharge and  redness. Negative for photophobia.  Respiratory:  Negative for cough.   Psychiatric/Behavioral: Negative.      Physical Exam Vitals reviewed.  Constitutional:      Appearance: Normal appearance. She is normal weight.  HENT:     Right Ear: Tympanic membrane and ear canal normal.     Left Ear: Tympanic membrane and ear canal normal.     Nose: Congestion present.     Comments: Turbinates red    Mouth/Throat:     Mouth: Mucous membranes are moist.     Pharynx: Oropharynx is clear.  Eyes:     General:        Right eye: Discharge present.     Pupils: Pupils are equal, round, and reactive to light.     Comments: Eye red and conjunctiva inflamed  Neurological:     Mental Status: She is alert.       Assessment/Plan:  1. Acute conjunctivitis of right eye, unspecified acute conjunctivitis type Unclear whether just from the eye cream or related to upper respiratory symptoms - gentamicin (GARAMYCIN) 0.3 % ophthalmic solution; Place 1 drop into the right eye every 4 (four) hours.  Dispense: 5 mL; Refill: 0      General Counseling: Michiko verbalizes understanding of the findings of todays visit and agrees with plan of treatment. I have discussed any further diagnostic evaluation that may be needed or ordered today. We also reviewed her medications  today. she has been encouraged to call the office with any questions or concerns that should arise related to todays visit.   No orders of the defined types were placed in this encounter.   No orders of the defined types were placed in this encounter.   Time spent:15 Minutes   Dr Durwin Reges Treyvon Blahut [facility] Physician

## 2022-03-17 ENCOUNTER — Other Ambulatory Visit: Payer: Self-pay | Admitting: Family Medicine

## 2022-03-17 DIAGNOSIS — H1031 Unspecified acute conjunctivitis, right eye: Secondary | ICD-10-CM

## 2022-03-18 ENCOUNTER — Ambulatory Visit
Admission: RE | Admit: 2022-03-18 | Discharge: 2022-03-18 | Disposition: A | Discharge status: Home / Self Care | Payer: 59 | Source: Ambulatory Visit | Attending: Urgent Care | Admitting: Urgent Care

## 2022-03-18 VITALS — BP 96/61 | HR 76 | Temp 98.5°F | Resp 12

## 2022-03-18 DIAGNOSIS — L739 Follicular disorder, unspecified: Secondary | ICD-10-CM

## 2022-03-18 DIAGNOSIS — B9689 Other specified bacterial agents as the cause of diseases classified elsewhere: Secondary | ICD-10-CM | POA: Diagnosis not present

## 2022-03-18 DIAGNOSIS — H109 Unspecified conjunctivitis: Secondary | ICD-10-CM

## 2022-03-18 MED ORDER — MUPIROCIN 2 % EX OINT: TOPICAL_OINTMENT | Freq: Two times a day (BID) | CUTANEOUS | 0 refills | Status: AC

## 2022-03-18 MED ORDER — POLYMYXIN B-TRIMETHOPRIM 10000-0.1 UNIT/ML-% OP SOLN: 1.0000 [drp] | OPHTHALMIC | 0 refills | Status: AC

## 2022-03-18 NOTE — Discharge Instructions (Signed)
Follow-up with your primary care provider or here if your symptoms do not respond to treatment.

## 2022-03-18 NOTE — ED Triage Notes (Signed)
Patient c/o "ingrown hair" x 3-4 days ago.   Patient endorses onset of symptoms began after waxing.   Patient endorses increased redness and discoloration to site.   Patient endorses problem area is along bikini line.   Patient has used oil serum and warm showers with no relief of symptoms.   Patient c/o bilateral eye redness and eye drainage x 2 weeks ago.   Patient denies eye trauma or vision changes.   Patient endorses pus drainage and eye crusting.   Patient has used Gentamicin with some relief of symptoms but has ran out of the prescription.

## 2022-03-18 NOTE — ED Provider Notes (Signed)
Erica Sweeney    CSN: [id number] Arrival date & time: 03/18/22  0847      History   Chief Complaint Chief Complaint  Patient presents with   Skin Problem   Eye Problem   APPT 0900    HPI Erica Sweeney is a 21 y.o. female.    Eye Problem   Patient presents to urgent care with 2 complaints:  She reports "ingrown hair "x3 to 4 days.  Symptoms started after waxing her suprapubic area.  Endorses redness and swelling at the site.  She has tried naturopathic treatments such as "oil serum" without relief of symptoms.  She reports bilateral redness in her eyes with drainage x2 weeks.  Endorses crusting of eyes in the morning with drainage throughout the day.  She states she had a prescription for gentamicin and used the medication with some relief of symptoms but ran out and symptoms have returned.  Denies any vision changes or eye trauma.    Past Medical History:  Diagnosis Date   Kidney stone    POTS (postural orthostatic tachycardia syndrome)     There are no problems to display for this patient.   Past Surgical History:  Procedure Laterality Date   LITHOTRIPSY      OB History   No obstetric history on file.      Home Medications    Prior to Admission medications   Medication Sig Start Date End Date Taking? Authorizing Provider  mupirocin ointment (BACTROBAN) 2 % Apply topically 2 (two) times daily for 7 days. 03/18/22 03/25/22 Yes Deionte Spivack, Jeannett Senior, FNP  spironolactone (ALDACTONE) 25 MG tablet Take 25 mg by mouth daily.   Yes [provider]  trimethoprim-polymyxin b (POLYTRIM) ophthalmic solution Place 1 drop into both eyes every 4 (four) hours for 7 days. 03/18/22 03/25/22 Yes Srishti Strnad, Jeannett Senior, FNP    Family History History reviewed. No pertinent family history.  Social History Social History   Tobacco Use   Smoking status: Never   Smokeless tobacco: Never  Substance Use Topics   Alcohol use: Yes   Drug use: Never      Allergies   Morphine and related   Review of Systems Review of Systems   Physical Exam Triage Vital Signs ED Triage Vitals  Enc Vitals Group     BP 03/18/22 0909 96/61     Pulse Rate 03/18/22 0909 76     Resp 03/18/22 0909 12     Temp 03/18/22 0909 98.5 F (36.9 C)     Temp Source 03/18/22 0909 Oral     SpO2 03/18/22 0909 95 %     Weight --      Height --      Head Circumference --      Peak Flow --      Pain Score 03/18/22 0908 0     Pain Loc --      Pain Edu? --      Excl. in GC? --    No data found.  Updated Vital Signs BP 96/61 (BP Location: Left Arm)   Pulse 76   Temp 98.5 F (36.9 C) (Oral)   Resp 12   LMP 03/07/2022 (Exact Date)   SpO2 95%   Visual Acuity Right Eye Distance:   Left Eye Distance:   Bilateral Distance:    Right Eye Near:   Left Eye Near:    Bilateral Near:     Physical Exam Eyes:     General:  Right eye: Discharge present.        Left eye: Discharge present.    Pupils: Pupils are equal, round, and reactive to light.  Skin:    Findings: Lesion present.           UC Treatments / Results  Labs (all labs ordered are listed, but only abnormal results are displayed) Labs Reviewed - No data to display  EKG   Radiology No results found.  Procedures Incision and Drainage  Date/Time: 03/18/2022 9:43 AM  Performed by: Charma Igo, FNP Authorized by: Charma Igo, FNP   Consent:    Consent obtained:  Verbal   Consent given by:  Patient   Risks, benefits, and alternatives were discussed: yes     Risks discussed:  Pain and infection   Alternatives discussed:  No treatment Universal protocol:    Procedure explained and questions answered to patient or proxy's satisfaction: yes     Relevant documents present and verified: no     Test results available : no     Imaging studies available: no     Required blood products, implants, devices, and special equipment available: no     Site/side marked:  no     Immediately prior to procedure, a time out was called: no     Patient identity confirmed:  Verbally with patient Location:    Type:  Abscess   Size:  1, 1, 3 mm   Location: Suprapubic. Pre-procedure details:    Skin preparation:  Povidone-iodine Sedation:    Sedation type:  None Anesthesia:    Anesthesia method:  Topical application Procedure type:    Complexity:  Simple Procedure details:    Ultrasound guidance: no     Needle aspiration: yes     Needle size:  22 G   Wound management:  Probed and deloculated   Drainage:  Bloody   Drainage amount:  Scant   Wound treatment:  Wound left open   Packing materials:  None Post-procedure details:    Procedure completion:  Tolerated Comments:     Following the procedure, applied triple antibiotic ointment to the three wounds and covered with a large Band-Aid.  (including critical care time)  Medications Ordered in UC Medications - No data to display  Initial Impression / Assessment and Plan / UC Course  I have reviewed the triage vital signs and the nursing notes.  Pertinent labs & imaging results that were available during my care of the patient were reviewed by me and considered in my medical decision making (see chart for details).   Provided Polytrim to treat her bacterial conjunctivitis.  Minor I&D performed to debride 3 folliculitis sites in her suprapubic region.  As described elsewhere.   Final Clinical Impressions(s) / UC Diagnoses   Final diagnoses:  Folliculitis  Bacterial conjunctivitis of both eyes     Discharge Instructions      Follow-up with your primary care provider or here if your symptoms do not respond to treatment.    ED Prescriptions     Medication Sig Dispense Auth. Provider   mupirocin ointment (BACTROBAN) 2 % Apply topically 2 (two) times daily for 7 days. 15 g Holland Nickson, FNP   trimethoprim-polymyxin b (POLYTRIM) ophthalmic solution Place 1 drop into both eyes every 4  (four) hours for 7 days. 10 mL Dain Laseter, Jeannett Senior, FNP      PDMP not reviewed this encounter.   Charma Igo, [place] 03/18/22 [phone]

## 2022-04-08 ENCOUNTER — Ambulatory Visit (INDEPENDENT_AMBULATORY_CARE_PROVIDER_SITE_OTHER): Payer: 59 | Admitting: Medical

## 2022-04-08 ENCOUNTER — Other Ambulatory Visit: Payer: Self-pay

## 2022-04-08 ENCOUNTER — Encounter: Payer: Self-pay | Admitting: Medical

## 2022-04-08 VITALS — BP 103/73 | HR 81 | Temp 99.3°F | Wt 122.0 lb

## 2022-04-08 DIAGNOSIS — J029 Acute pharyngitis, unspecified: Secondary | ICD-10-CM

## 2022-04-08 DIAGNOSIS — H1033 Unspecified acute conjunctivitis, bilateral: Secondary | ICD-10-CM | POA: Diagnosis not present

## 2022-04-08 LAB — POCT RAPID STREP A (OFFICE): Rapid Strep A Screen: NEGATIVE

## 2022-04-08 MED ORDER — OFLOXACIN 0.3 % OP SOLN: 1.0000 [drp] | Freq: Four times a day (QID) | OPHTHALMIC | 0 refills | Status: AC

## 2022-04-08 NOTE — Progress Notes (Signed)
[facility] 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  Having eye issues for over a month.   Initially began with sx in R eye, next day had sx in L eye. [facility] 03[facility]. Used Gentamicin drops for at least a week. Sx did seem to resolve.   Sx reoccurred 2-3 days after stopping Gentamicin drops. Seen at urgent care, prescribed Polytrim drops, thinks she completed these. Sx improved but did not resolve with these. Sx returned/worsened about 1 day after Polytrim drops.   Sx same each time. Waking up with eyes swollen shut or crusted over. Sometimes has d/c during day but not every day. Does not wear contact lenses. Threw away makeup. Has worn makeup a couple times in last few weeks but buys new/inexpensive makeup and throws it away after using once. Disinfected brushes. Eyes sometimes tender when swollen, sting some when dry.  Noted sore throat yesterday, hurts to swallow. She reports chronic sinus infections in last year and a half. Has not seen ENT. Not much congestion currently. Some mucus in throat, no cough. Mildly fatigued and mild HA. No myalgias.  Denies sick contacts, roommates with some mild URI sx. Taking Zyrtec and Flonase daily, has hx of allergies.     Current Medication:  Outpatient Encounter Medications as of 04/08/2022  Medication Sig   spironolactone (ALDACTONE) 25 MG tablet Take 25 mg by mouth daily.   No facility-administered encounter medications on file as of 04/08/2022.      Medical History: Past Medical History:  Diagnosis Date   Kidney stone    POTS (postural orthostatic tachycardia syndrome)      Vital Signs: BP  103/73   Pulse 81   Temp 99.3 F (37.4 C) (Tympanic)   Wt 122 lb (55.3 kg)   LMP 03/07/2022 (Exact Date)   SpO2 97%   BMI 19.99 kg/m    Review of Systems  Constitutional:  Positive for fatigue. Negative for chills and fever.  HENT:  Positive for congestion (mild) and sore throat.   Eyes:  Positive for discharge and redness. Negative for visual disturbance.  Respiratory:  Negative for cough.   Musculoskeletal:  Negative for myalgias.  Neurological:  Positive for headaches.    Physical Exam Vitals reviewed.  Constitutional:      General: She is not in acute distress.    Appearance: She is not ill-appearing.  HENT:     Head: Normocephalic.     Right Ear: Ear canal and external ear normal.     Left Ear: Ear canal and external ear normal.     Ears:     Comments: TMs slightly dull    Nose: Mucosal edema present. No congestion or rhinorrhea.     Comments: Nasal mucosa slightly red    Mouth/Throat:     Mouth: Mucous membranes are moist. No oral lesions.     Pharynx: Posterior oropharyngeal erythema (mild) present. No pharyngeal swelling.     Tonsils: No tonsillar exudate. 1+ on the right. 1+ on the left.     Comments: Tonsils not inflamed. Eyes:     General: Lids are normal.  Right eye: No discharge or hordeolum.        Left eye: No discharge or hordeolum.     Comments: Conjunctivae mildly erythematous bilaterally, sclerae mildly injected.  Cardiovascular:     Rate and Rhythm: Normal rate and regular rhythm.     Heart sounds: No murmur heard.    No friction rub. No gallop.  Pulmonary:     Effort: Pulmonary effort is normal.     Breath sounds: Normal breath sounds. No wheezing, rhonchi or rales.  Musculoskeletal:     Cervical back: Neck supple. No rigidity.  Lymphadenopathy:     Cervical: Cervical adenopathy (small anterior cervical nodes, slightly tender) present.  Neurological:     Mental Status: She is alert.    Results for orders placed or performed in visit  on 04/08/22 (from the past 24 hour(s))  POCT rapid strep A     Status: Normal   Collection Time: 04/08/22  2:43 PM  Result Value Ref Range   Rapid Strep A Screen Negative Negative     Assessment/Plan: 1. Acute conjunctivitis of both eyes, unspecified acute conjunctivitis type Recurrent bacterial infection vs conjunctivitis due to another cause (i.e. virus, allergies). Will treat with Ofloxacin drops. Discussed that if eye symptoms do not resolve, should see ophthalmology.  - ofloxacin (OCUFLOX) 0.3 % ophthalmic solution; Place 1 drop into both eyes 4 (four) times daily for 7 days.  Dispense: 5 mL; Refill: 0  2. Pharyngitis, unspecified etiology Will check strep culture. Patient declined rapid COVID-19 antigen test. - POCT rapid strep A; Result: NEGATIVE - Culture, Group A Strep   Patient Instructions:  -Use antibiotic eye drops as prescribed. -Apply cool compress (cold washcloth) over eyes for 10-20 minutes as needed for pain/swelling/discomfort. -Avoid touching your eyes. When you must touch them, wash your hands or use hand sanitizer afterwards. -Do not wear eye makeup until your symptoms fully resolve. Discard mascara or eye makeup worn recently. -Do not share towels/washcloths with others.  -Change/wash pillowcases after few days on antibiotic drops and if visibly dirty.  -Rest and stay well hydrated (by drinking water and other liquids). Avoid/limit caffeine. -Take over-the-counter medicines (i.e. Dayquil, Nyquil, Ibuprofen) to help relieve your symptoms. -For your sore throat, use cough drops/throat lozenges, gargle warm salt water and/or drink warm liquids (like tea with honey). -Send MyChart message to provider or schedule return visit as needed for new/worsening symptoms or if symptoms do not resolve as discussed with recommended treatment over next 7 days.   General Counseling: [name] verbalizes understanding of the findings of todays visit and agrees with plan of  treatment. I have discussed any further diagnostic evaluation that may be needed or ordered today. We also reviewed her medications today. she has been encouraged to call the office with any questions or concerns that should arise related to todays visit.   No orders of the defined types were placed in this encounter.   No orders of the defined types were placed in this encounter.   Time spent:20 [place] PA-C [place] 04/08/2022 2:16 PM

## 2022-04-10 LAB — CULTURE, GROUP A STREP: Strep A Culture: NEGATIVE

## 2022-04-12 ENCOUNTER — Encounter: Payer: Self-pay | Admitting: Medical

## 2022-05-21 ENCOUNTER — Ambulatory Visit (INDEPENDENT_AMBULATORY_CARE_PROVIDER_SITE_OTHER): Payer: 59 | Admitting: Medical

## 2022-05-21 ENCOUNTER — Other Ambulatory Visit: Payer: Self-pay

## 2022-05-21 ENCOUNTER — Encounter: Payer: Self-pay | Admitting: Medical

## 2022-05-21 VITALS — BP 110/68 | HR 84 | Temp 96.2°F | Wt 128.0 lb

## 2022-05-21 DIAGNOSIS — R3915 Urgency of urination: Secondary | ICD-10-CM

## 2022-05-21 DIAGNOSIS — Z87442 Personal history of urinary calculi: Secondary | ICD-10-CM | POA: Diagnosis not present

## 2022-05-21 DIAGNOSIS — R1030 Lower abdominal pain, unspecified: Secondary | ICD-10-CM

## 2022-05-21 LAB — POCT URINALYSIS DIPSTICK (MANUAL)
Leukocytes, UA: NEGATIVE
Nitrite, UA: NEGATIVE
Poct Bilirubin: NEGATIVE
Poct Blood: NEGATIVE
Poct Glucose: NORMAL mg/dL
Poct Ketones: NEGATIVE
Poct Protein: NEGATIVE mg/dL
Poct Urobilinogen: NORMAL mg/dL
Spec Grav, UA: <=1.005 — AB (ref 1.010–1.025)
pH, UA: 7 (ref 5.0–8.0)

## 2022-05-21 NOTE — Progress Notes (Signed)
[facility] [address] [place], [place] [address] Phone: [phone] Fax: [phone]   Office Visit Note  Patient Name: Erica Sweeney  Date of Birth:[birth year]  Med Rec number [id number]  Date of Service: 05/21/2022  Allergies: Morphine and related  Chief Complaint  Patient presents with   sick     HPI 21 YO female with history of kidney stones presents with abdominal pain.  2 nights ago, noted stabbing pain to R lower abdomen. Was woken up by pain to bilateral upper abdomen overnight that same night, suspected pain "in her kidneys", dull (similar to pain experienced before with kidney stones). Took Flomax and Ketorlac that night. Was not in pain when she woke up but felt upper abdomen looked bloated. Pain to upper abdomen has been coming and going, not relieved with urinating. Mild urinary frequency and urgency. Drinking more fluids, Crystal Light, apple cider vinegar. No burning with urination, no hematuria, no fever or chills. No flank/back pain, nausea or vomiting. 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 No hx of UTI except in setting of obstruction due to stones. Has had about 10 kidney stones in last 7-8 years. Has had lithotripsy in past. Did fly over weekend, suspects this could have shifted stones. Has urologist at home.  Pt states she has suspected SIBO, will be tested over upcoming break. No changes to bowel habits recently. Also plans to see eye doctor over break for recurring conjunctivitis symptoms.   Current Medication:  Outpatient Encounter Medications as of 05/21/2022  Medication Sig   spironolactone (ALDACTONE) 25 MG tablet Take 25 mg by mouth daily.   No facility-administered encounter medications on file as of 05/21/2022.      Medical History: Past Medical History:  Diagnosis Date   Kidney stone    POTS (postural orthostatic tachycardia syndrome)    Seasonal allergies      Vital Signs: BP  110/68   Pulse 84   Temp (!) 96.2 F (35.7 C) (Tympanic)   Wt 128 lb (58.1 kg)   SpO2 98%   BMI 20.98 kg/m    Review of Systems See HPI  Physical Exam Vitals reviewed.  Constitutional:      General: She is not in acute distress.    Appearance: She is not ill-appearing.  Cardiovascular:     Rate and Rhythm: Normal rate and regular rhythm.     Heart sounds: No murmur heard.    No friction rub. No gallop.  Pulmonary:     Effort: Pulmonary effort is normal.     Breath sounds: Normal breath sounds. No wheezing, rhonchi or rales.  Abdominal:     General: Bowel sounds are normal. There is distension (slightly).     Palpations: Abdomen is soft. There is no mass.     Tenderness: There is abdominal tenderness in the right lower quadrant. There is no right CVA tenderness, left CVA tenderness or guarding. Negative signs include McBurney's sign.     Comments: Mild generalized tenderness/discomfort to upper abdomen bilaterally  Neurological:     Mental Status: She is alert.    POCT Urinalysis Dip Manual     Status: Abnormal   Collection Time: 05/21/22  1:19 PM  Result Value Ref Range   Spec Grav, UA <=1.005 (A) 1.010 - 1.025   pH, UA 7.0 5.0 - 8.0   Leukocytes, UA Negative Negative   Nitrite, UA Negative Negative   Poct Protein Negative Negative, trace mg/dL  Poct Glucose Normal Normal mg/dL   Poct Ketones Negative Negative   Poct Urobilinogen Normal Normal mg/dL   Poct Bilirubin Negative Negative   Poct Blood Negative Negative, trace    Assessment/Plan: 1. Lower abdominal pain 2. Urinary urgency 3. History of kidney stones Discussed normal urinalysis result. Will send urine for culture. Patient does not appear to be in significant pain currently. Agree that symptoms are somewhat concerning for stone given past history. Encouraged to continue pushing fluids, consider repeating Flomax and Toradol as needed. Encouraged to call urologist for additional advice.   - POCT  Urinalysis Dip Manual - Urine Culture   Patient Instructions:  Continue pushing fluids, avoid caffeine and alcohol. Consider repeating Flomax and Toradol as needed.  Consider calling urologist for additional advice.   -You will receive a MyChart message notifying you of your lab results when they are available.  -Send MyChart message to provider, call urologist or visit urgent care at home over break as needed for new/worsening symptoms (i.e. fever, increased pain, blood in urine, vomiting) or if symptoms do not improve as discussed with recommended treatment.    General Counseling: Angeleena verbalizes understanding of the findings of todays visit and agrees with plan of treatment. I have discussed any further diagnostic evaluation that may be needed or ordered today. she has been encouraged to call the office with any questions or concerns that should arise related to todays visit.   No orders of the defined types were placed in this encounter.   No orders of the defined types were placed in this encounter.   Time spent:20 Minutes    Jonathon Resides PA-C [facility] 05/21/2022 10:17 AM

## 2022-05-23 LAB — URINE CULTURE: Organism ID, Bacteria: NO GROWTH

## 2022-05-24 ENCOUNTER — Encounter: Payer: Self-pay | Admitting: Medical

## 2022-05-24 NOTE — Patient Instructions (Signed)
Continue pushing fluids, avoid caffeine and alcohol. Consider repeating Flomax and Toradol as needed.  Consider calling urologist for additional advice.   -You will receive a MyChart message notifying you of your lab results when they are available.  -Send MyChart message to provider, call urologist or visit urgent care at home over break as needed for new/worsening symptoms (i.e. fever, increased pain, blood in urine, vomiting) or if symptoms do not improve as discussed with recommended treatment.

## 2022-07-09 ENCOUNTER — Emergency Department: Admission: EM | Admit: 2022-07-09 | Discharge: 2022-07-09 | Discharge status: Against Medical Advice | Payer: 59

## 2022-07-14 ENCOUNTER — Ambulatory Visit (INDEPENDENT_AMBULATORY_CARE_PROVIDER_SITE_OTHER): Payer: 59 | Admitting: Family Medicine

## 2022-07-14 ENCOUNTER — Other Ambulatory Visit: Payer: Self-pay

## 2022-07-14 ENCOUNTER — Encounter: Payer: Self-pay | Admitting: Family Medicine

## 2022-07-14 VITALS — BP 114/74 | Temp 97.8°F | Resp 18 | Ht 65.5 in | Wt 124.0 lb

## 2022-07-14 DIAGNOSIS — H1033 Unspecified acute conjunctivitis, bilateral: Secondary | ICD-10-CM

## 2022-07-14 DIAGNOSIS — J302 Other seasonal allergic rhinitis: Secondary | ICD-10-CM | POA: Diagnosis not present

## 2022-07-14 DIAGNOSIS — K638219 Small intestinal bacterial overgrowth, unspecified: Secondary | ICD-10-CM | POA: Diagnosis not present

## 2022-07-14 MED ORDER — MOXIFLOXACIN HCL 0.5 % OP SOLN: 1.0000 [drp] | Freq: Three times a day (TID) | OPHTHALMIC | 0 refills | Status: DC

## 2022-07-14 NOTE — Progress Notes (Signed)
[address] [place], [place] [address] Phone: [phone] Fax: [phone]   Office Visit Note  Patient Name: Erica Sweeney  Date of [id number]  Med Rec number [id number]  Date of Service: 07/14/2022  Morphine and related  Chief Complaint  Patient presents with   Conjunctivitis     Here to discuss recurrent conjunctivitis. Saw an ophthalmologist at home for this and was prescribed moxifloxacin which helped but symptoms flared 2 days after stopping it Finished last set of antibiotic drops on Saturday - and symptoms have recurred Experiences burning of the eyes and thick discharge which sticks the lids together - had some left over Moxifloxacin and has used it today  Does have history of seasonal allergies and has used Zaditor in the past - still has this - has just restarted her Zyrtec Also has some Systane for dry eyes. Ophthalmologist also advised drops for dryness  Makaley has history of chronic sinus issues and wonders whether the current trigger might be an untreated sinus infection - she has been reluctant to take oral antibiotics as she has SIBO, but she has been prescribed Rifaximin and should be getting that in the next few weeks    Conjunctivitis       Current Medication:  Outpatient Encounter Medications as of 07/14/2022  Medication Sig   spironolactone (ALDACTONE) 25 MG tablet Take 25 mg by mouth daily.   No facility-administered encounter medications on file as of 07/14/2022.      Medical History: Past Medical History:  Diagnosis Date   Kidney stone    POTS (postural orthostatic tachycardia syndrome)    Seasonal allergies      Vital Signs: BP 114/74   Temp 97.8 F (36.6 C) (Tympanic)   Resp 18   Ht 5' 5.5" (1.664 m)   Wt 124 lb (56.2 kg)   SpO2 99%   BMI 20.32 kg/m    Review of Systems  Constitutional: Negative.   HENT:         See HPI  Genitourinary:        Has SIBO  Allergic/Immunologic: Positive for  environmental allergies.    Physical Exam Vitals reviewed.  Constitutional:      General: She is not in acute distress.    Appearance: Normal appearance.  HENT:     Right Ear: Tympanic membrane normal.     Left Ear: Tympanic membrane normal.     Nose:     Right Turbinates: Enlarged.     Right Sinus: No maxillary sinus tenderness or frontal sinus tenderness.     Left Sinus: No maxillary sinus tenderness or frontal sinus tenderness.  Eyes:     Comments: Vernal conjunctivitis Eyes also appear dry No discharge or erythema currently  Neurological:     Mental Status: She is alert.    Assessment/Plan:  1. Acute conjunctivitis of both eyes, unspecified acute conjunctivitis type Will treat based on history - moxifloxacin (VIGAMOX) 0.5 % ophthalmic solution; Place 1 drop into both eyes 3 (three) times daily.  Dispense: 3 mL; Refill: 0  2. Seasonal allergies Suspect underlying allergic conjunctivitis may be trigger for recurrent bacterial infection When moxifloxacin finished advise starting both Zaditor and Systane daily until May  3. Small intestinal bacterial overgrowth (SIBO) Will avoid any oral antibiotics unless we have no choice. No evidence for active sinus infection today      General Counseling: Maudy verbalizes understanding of the findings of todays visit and agrees with plan of treatment. I have  discussed any further diagnostic evaluation that may be needed or ordered today. We also reviewed her medications today. she has been encouraged to call the office with any questions or concerns that should arise related to todays visit.   No orders of the defined types were placed in this encounter.   No orders of the defined types were placed in this encounter.    Dr Durwin Reges Eann Cleland AB[facility]

## 2022-07-20 ENCOUNTER — Ambulatory Visit
Admission: RE | Admit: 2022-07-20 | Discharge: 2022-07-20 | Disposition: A | Discharge status: Home / Self Care | Payer: 59 | Source: Ambulatory Visit

## 2022-07-20 VITALS — BP 113/72 | HR 60 | Temp 97.8°F | Resp 18 | Ht 66.0 in | Wt 122.0 lb

## 2022-07-20 DIAGNOSIS — S80212A Abrasion, left knee, initial encounter: Secondary | ICD-10-CM

## 2022-07-20 NOTE — Discharge Instructions (Addendum)
Keep your wound clean and dry.  Wash it gently twice a day with soap and water.  Apply mupirocin twice a day.    Follow up right away if you see signs of infection, such as redness, pus-like drainage, warmth, fever, chills, or other concerning symptoms.

## 2022-07-20 NOTE — ED Triage Notes (Signed)
Patient to Urgent Care with complaints of wound present to her left knee x2 weeks. Reports concern for possible infection.  Reports she fell and skinned her knee. Over the last two weeks has started draining. Cleaning area with neosporin and applying a band-aid.

## 2022-07-20 NOTE — ED Provider Notes (Signed)
[facility]    CSN: [id number] Arrival date & time: 07/20/22  1413      History   Chief Complaint Chief Complaint  Patient presents with   Wound Check    Looks green - Entered by patient    HPI Erica Sweeney is a 22 y.o. female.  Patient presents with an abrasion on her left knee x 2 weeks when she accidentally fell and landed on her knees.  She has been treating the area with Neosporin.  She is concerned for infection because she has seen drainage on the bandaid when changing it.  She denies fever, chills, redness, numbness, weakness, or other symptoms.  Her medical history includes migraine headaches, asthma, PCOS, postural orthostatic tachycardia syndrome, Raynaud's phenomenon, seasonal allergies. Last tetanus 2021.  The history is provided by the patient and medical records.    Past Medical History:  Diagnosis Date   Kidney stone    POTS (postural orthostatic tachycardia syndrome)    Seasonal allergies     Patient Active Problem List   Diagnosis Date Noted   Asthma 07/30/2021   Polycystic ovary syndrome 07/30/2021   Postural orthostatic tachycardia syndrome 07/30/2021   Raynaud's phenomenon 07/30/2021   Uric acid renal calculus 07/30/2021   Intractable chronic migraine without aura and without status migrainosus 07/02/2021   Elevated antinuclear antibody (ANA) level 04/19/2013    Past Surgical History:  Procedure Laterality Date   LITHOTRIPSY      OB History   No obstetric history on file.      Home Medications    Prior to Admission medications   Medication Sig Start Date End Date Taking? Authorizing Provider  tretinoin (RETIN-A) 0.05 % cream SMARTSIG:1 Topical Daily 07/08/22  Yes [provider]  moxifloxacin (VIGAMOX) 0.5 % ophthalmic solution Place 1 drop into both eyes 3 (three) times daily. 07/14/22   Dutch Gray, MD  rifaximin (XIFAXAN) 550 MG TABS tablet     [provider]  spironolactone (ALDACTONE) 25 MG tablet  Take 25 mg by mouth daily.    [provider]    Family History History reviewed. No pertinent family history.  Social History Social History   Tobacco Use   Smoking status: Never   Smokeless tobacco: Never  Substance Use Topics   Alcohol use: Yes   Drug use: Never     Allergies   Morphine and related   Review of Systems Review of Systems  Constitutional:  Negative for chills and fever.  Musculoskeletal:  Negative for arthralgias and joint swelling.  Skin:  Positive for wound. Negative for color change.  Neurological:  Negative for weakness and numbness.  All other systems reviewed and are negative.    Physical Exam Triage Vital Signs ED Triage Vitals [07/20/22 1430]  Enc Vitals Group     BP 113/72     Pulse Rate 60     Resp 18     Temp 97.8 F (36.6 C)     Temp src      SpO2 97 %     Weight      Height      Head Circumference      Peak Flow      Pain Score      Pain Loc      Pain Edu?      Excl. in [place]?    No data found.  Updated Vital Signs BP 113/72   Pulse 60   Temp 97.8 F (36.6 C)   Resp  18   Ht 5\' 6"  (1.676 m)   Wt 122 lb (55.3 kg)   LMP 06/19/2022   SpO2 97%   BMI 19.69 kg/m   Visual Acuity Right Eye Distance:   Left Eye Distance:   Bilateral Distance:    Right Eye Near:   Left Eye Near:    Bilateral Near:     Physical Exam Vitals and nursing note reviewed.  Constitutional:      General: She is not in acute distress.    Appearance: Normal appearance. She is well-developed. She is not ill-appearing.  HENT:     Mouth/Throat:     Mouth: Mucous membranes are moist.  Cardiovascular:     Rate and Rhythm: Normal rate and regular rhythm.     Heart sounds: Normal heart sounds.  Pulmonary:     Effort: Pulmonary effort is normal. No respiratory distress.     Breath sounds: Normal breath sounds.  Musculoskeletal:        General: No swelling, tenderness or deformity. Normal range of motion.     Cervical back: Neck supple.   Skin:    General: Skin is warm and dry.     Capillary Refill: Capillary refill takes less than 2 seconds.     Findings: Lesion present. No erythema.     Comments: Superficial abrasion on left knee that appears to be healing well; no drainage or erythema.    Neurological:     General: No focal deficit present.     Mental Status: She is alert and oriented to person, place, and time.     Sensory: No sensory deficit.     Motor: No weakness.     Gait: Gait normal.  Psychiatric:        Mood and Affect: Mood normal.        Behavior: Behavior normal.      UC Treatments / Results  Labs (all labs ordered are listed, but only abnormal results are displayed) Labs Reviewed - No data to display  EKG   Radiology No results found.  Procedures Procedures (including critical care time)  Medications Ordered in UC Medications - No data to display  Initial Impression / Assessment and Plan / UC Course  I have reviewed the triage vital signs and the nursing notes.  Pertinent labs & imaging results that were available during my care of the patient were reviewed by me and considered in my medical decision making (see chart for details).    Abrasion of left knee.  Tetanus up-to-date.  Patient reports she has mupirocin ointment from previous issue and it is in-date.  Instructed her to use the mupirocin ointment twice a day after wound cleaning with soap and water.  Discussed signs of infection and instructed patient to follow up right away if she has concerns.  Education provided on abrasion.  She agrees to plan of care.   Final Clinical Impressions(s) / UC Diagnoses   Final diagnoses:  Abrasion of left knee, initial encounter     Discharge Instructions      Keep your wound clean and dry.  Wash it gently twice a day with soap and water.  Apply mupirocin twice a day.    Follow up right away if you see signs of infection, such as redness, pus-like drainage, warmth, fever, chills, or other  concerning symptoms.        ED Prescriptions   None    PDMP not reviewed this encounter.   Sharion Balloon, NP 07/20/22  1518  

## 2022-07-21 ENCOUNTER — Ambulatory Visit: Payer: 59 | Admitting: Adult Health

## 2022-08-18 ENCOUNTER — Other Ambulatory Visit: Payer: Self-pay | Admitting: Family Medicine

## 2022-08-18 DIAGNOSIS — H1033 Unspecified acute conjunctivitis, bilateral: Secondary | ICD-10-CM

## 2022-08-20 ENCOUNTER — Other Ambulatory Visit: Payer: Self-pay

## 2022-08-20 ENCOUNTER — Other Ambulatory Visit: Payer: Self-pay | Admitting: Family Medicine

## 2022-08-20 ENCOUNTER — Ambulatory Visit (INDEPENDENT_AMBULATORY_CARE_PROVIDER_SITE_OTHER): Payer: 59 | Admitting: Medical

## 2022-08-20 ENCOUNTER — Encounter: Payer: Self-pay | Admitting: Medical

## 2022-08-20 VITALS — BP 108/61 | HR 61 | Temp 95.4°F | Ht 65.51 in | Wt 116.0 lb

## 2022-08-20 DIAGNOSIS — H1033 Unspecified acute conjunctivitis, bilateral: Secondary | ICD-10-CM

## 2022-08-20 DIAGNOSIS — H1032 Unspecified acute conjunctivitis, left eye: Secondary | ICD-10-CM

## 2022-08-20 MED ORDER — MOXIFLOXACIN HCL 0.5 % OP SOLN: 1.0000 [drp] | Freq: Three times a day (TID) | OPHTHALMIC | 0 refills | Status: AC

## 2022-08-20 NOTE — Progress Notes (Signed)
[address]. [place], [place] [address] Phone: [phone] Fax: [phone]   Office Visit Note  Patient Name: Erica Sweeney  Date of [id number]  Med Rec number [id number]  Date of Service: 08/20/2022  Allergies: Morphine and related  Chief Complaint  Patient presents with   Eye Problem     HPI 22 YO female presents for possible conjunctivitis.  See prior notes. Has had recurring bouts of conjunctivitis over last 6 months. After last visit (07/14/22), used Moxifloxacin gtts, sx resolved. Using Systane eye drops TID since then. Has not started using Zaditor as sx seemed better with Systane.   Noted redness to left eye 2 days ago, also goopy and crusted shut in AM last two days. Left eye with small d/c during day, remains red. Denies respiratory symptoms (i.e. cough, sore throat, nasal congestion). Some itching and burning. No eye pain or vision change. Not using allergy medicine currently, does usually need this in spring for allergy season. Has thrown away eye make up multiple times.  Does not wear contacts. Saw eye doctor at home in Dec. May be going home again in 2 weeks.   Current Medication:  Outpatient Encounter Medications as of 08/20/2022  Medication Sig   spironolactone (ALDACTONE) 25 MG tablet Take 25 mg by mouth daily.   tretinoin (RETIN-A) 0.05 % cream SMARTSIG:1 Topical Daily   [DISCONTINUED] moxifloxacin (VIGAMOX) 0.5 % ophthalmic solution Place 1 drop into both eyes 3 (three) times daily.   [DISCONTINUED] rifaximin (XIFAXAN) 550 MG TABS tablet    No facility-administered encounter medications on file as of 08/20/2022.      Medical History: Past Medical History:  Diagnosis Date   Kidney stone    POTS (postural orthostatic tachycardia syndrome)    Seasonal allergies      Vital Signs: BP 108/61   Pulse 61   Temp (!) 95.4 F (35.2 C) (Tympanic)   Ht 5' 5.51" (1.664 m)   Wt 116 lb (52.6 kg)   SpO2 99%   BMI 19.00 kg/m     Review of Systems  Constitutional:  Negative for chills, fatigue and fever.  HENT:  Negative for congestion and sore throat.   Eyes:  Positive for discharge, redness and itching. Negative for photophobia, pain and visual disturbance.  Respiratory:  Negative for cough.     Physical Exam Vitals reviewed.  Constitutional:      General: She is not in acute distress.    Appearance: She is not ill-appearing.  HENT:     Right Ear: Tympanic membrane, ear canal and external ear normal.     Left Ear: Tympanic membrane, ear canal and external ear normal.     Nose: Mucosal edema present. No congestion or rhinorrhea.     Right Turbinates: Swollen.     Left Turbinates: Swollen.     Mouth/Throat:     Mouth: Mucous membranes are moist. No oral lesions.     Pharynx: Oropharynx is clear. No pharyngeal swelling or posterior oropharyngeal erythema.     Tonsils: No tonsillar exudate. 0 on the right. 0 on the left.  Eyes:     General: Lids are normal.        Right eye: No discharge or hordeolum.        Left eye: No discharge or hordeolum.     Conjunctiva/sclera:     Right eye: Right conjunctiva is injected (slight). No exudate.    Left eye: Left conjunctiva is injected (moderate). No exudate. Musculoskeletal:  Cervical back: Neck supple. No rigidity.  Lymphadenopathy:     Cervical: Cervical adenopathy (small anterior nodes, nontender) present.  Neurological:     Mental Status: She is alert.      Assessment/Plan: 1. Acute conjunctivitis of left eye, unspecified acute conjunctivitis type Based on history and current symptoms, will treat with Moxifloxacin drops. Advised to resume Systane drops after completing antibiotics, add Zaditor drops as well. Discussed scheduling follow up appointment with ophthalmologist when home next. If symptoms reoccur and unable to follow up at home, can refer to ophthalmologist locally. Reviewed hygiene/infection control measures.  - moxifloxacin (VIGAMOX)  0.5 % ophthalmic solution; Place 1 drop into the left eye 3 (three) times daily.  Dispense: 3 mL; Refill: 0  Patient Instructions:  -Use antibiotic eye drops as prescribed. -After completing antibiotic drops, resume Systane drops. Add Zaditor drops 1-2 times/day. -Apply cool compress (cold washcloth) over eyes for 10-20 minutes as needed for pain/swelling/discomfort. -Avoid touching your eyes. When you must touch them, wash your hands or use hand sanitizer afterwards. -Do not wear eye makeup until your symptoms fully resolve. Discard mascara or eye makeup worn recently. -Do not share towels/washcloths with others.  -Change/wash pillowcases after few days on antibiotic drops and if visibly dirty. -Schedule follow up visit with ophthalmologist when next at home. If your symptoms are not improving or return after antibiotics and you are not able to follow up at home, we can help refer you to a local ophthalmologist.  General Counseling: Erica Sweeney verbalizes understanding of the findings of todays visit and agrees with plan of treatment. she has been encouraged to call the office with any questions or concerns that should arise related to todays visit.   Time spent:15 [place] PA-C [place] 08/20/2022 2:18 PM

## 2022-08-24 ENCOUNTER — Encounter: Payer: Self-pay | Admitting: Medical

## 2022-08-24 NOTE — Patient Instructions (Addendum)
-  Use antibiotic eye drops as prescribed. -After completing antibiotic drops, resume Systane drops. Add Zaditor drops 1-2 times/day. -Apply cool compress (cold washcloth) over eyes for 10-20 minutes as needed for pain/swelling/discomfort. -Avoid touching your eyes. When you must touch them, wash your hands or use hand sanitizer afterwards. -Do not wear eye makeup until your symptoms fully resolve. Discard mascara or eye makeup worn recently. -Do not share towels/washcloths with others.  -Change/wash pillowcases after few days on antibiotic drops and if visibly dirty. -Schedule follow up visit with ophthalmologist when next at home. If your symptoms are not improving or return after antibiotics and you are not able to follow up at home, we can help refer you to a local ophthalmologist.

## 2023-07-09 IMAGING — CR DG FOOT COMPLETE 3+V*R*
1 series · 3 of 3 positions shown · non-contrast
Comparison: None.

CLINICAL DATA: Pain in the right fifth metatarsal.

EXAM:
RIGHT FOOT COMPLETE - 3+ VIEW

[Series 1: dg foot complete right · 0.14mm/px · 3 of 3 slices shown]
[im 1/3]
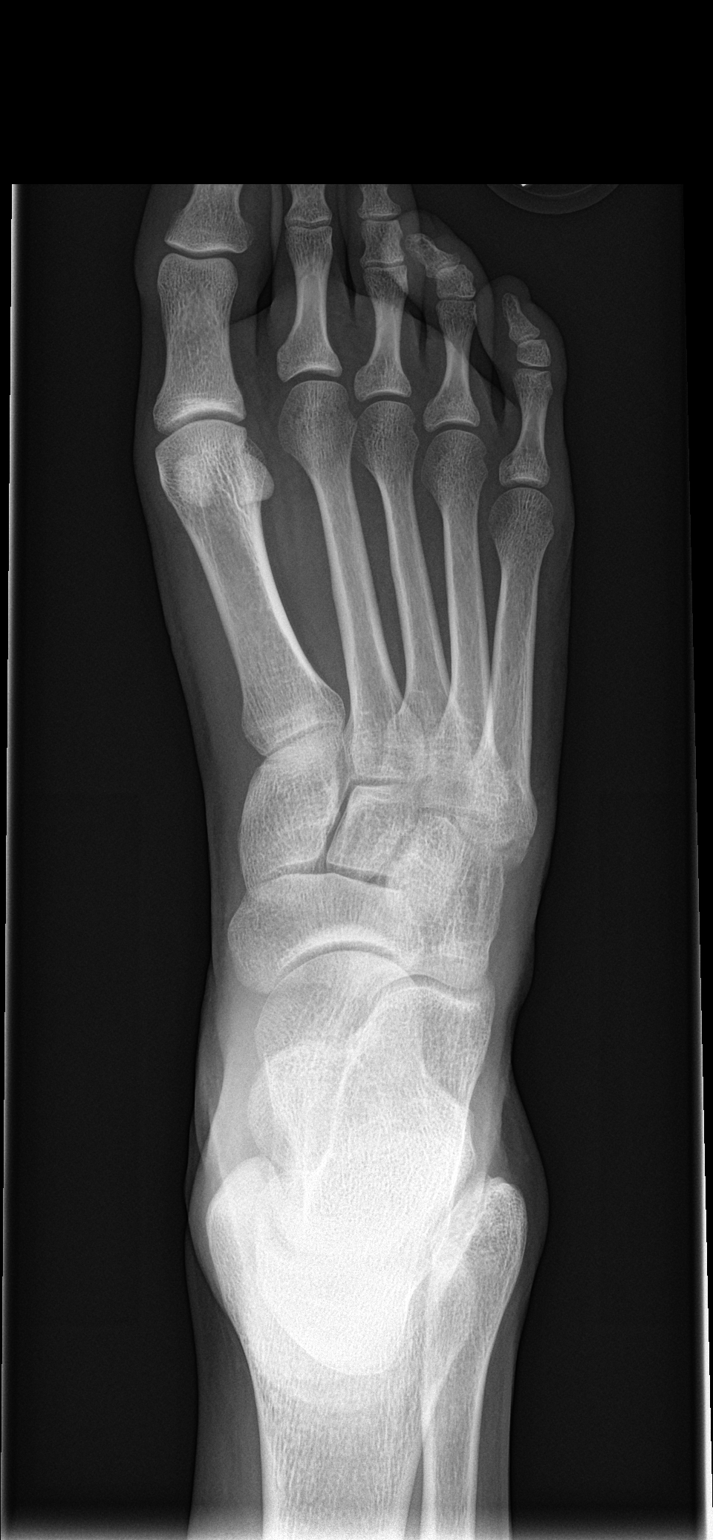
[im 2/3]
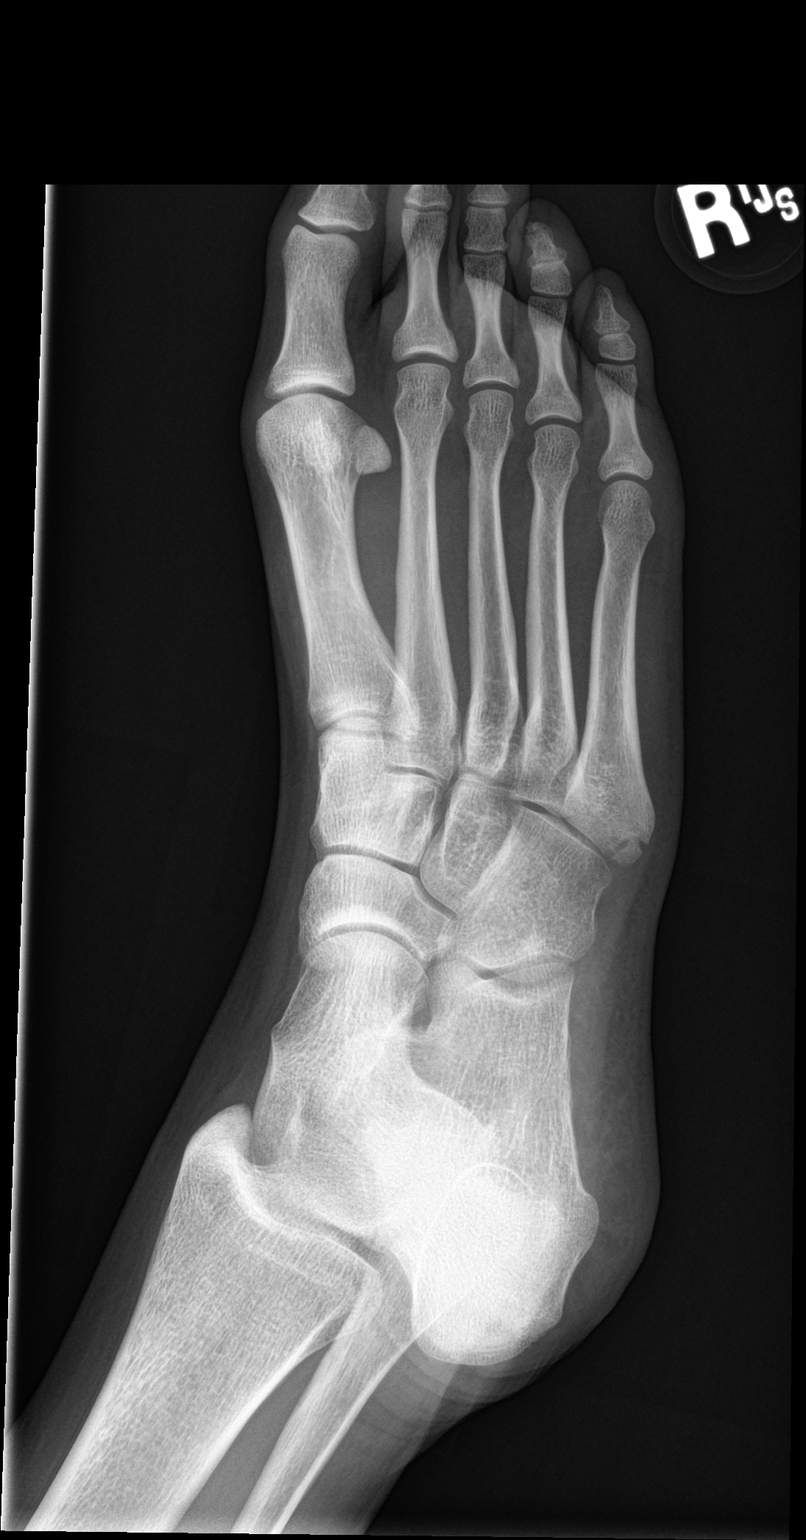
[im 3/3]
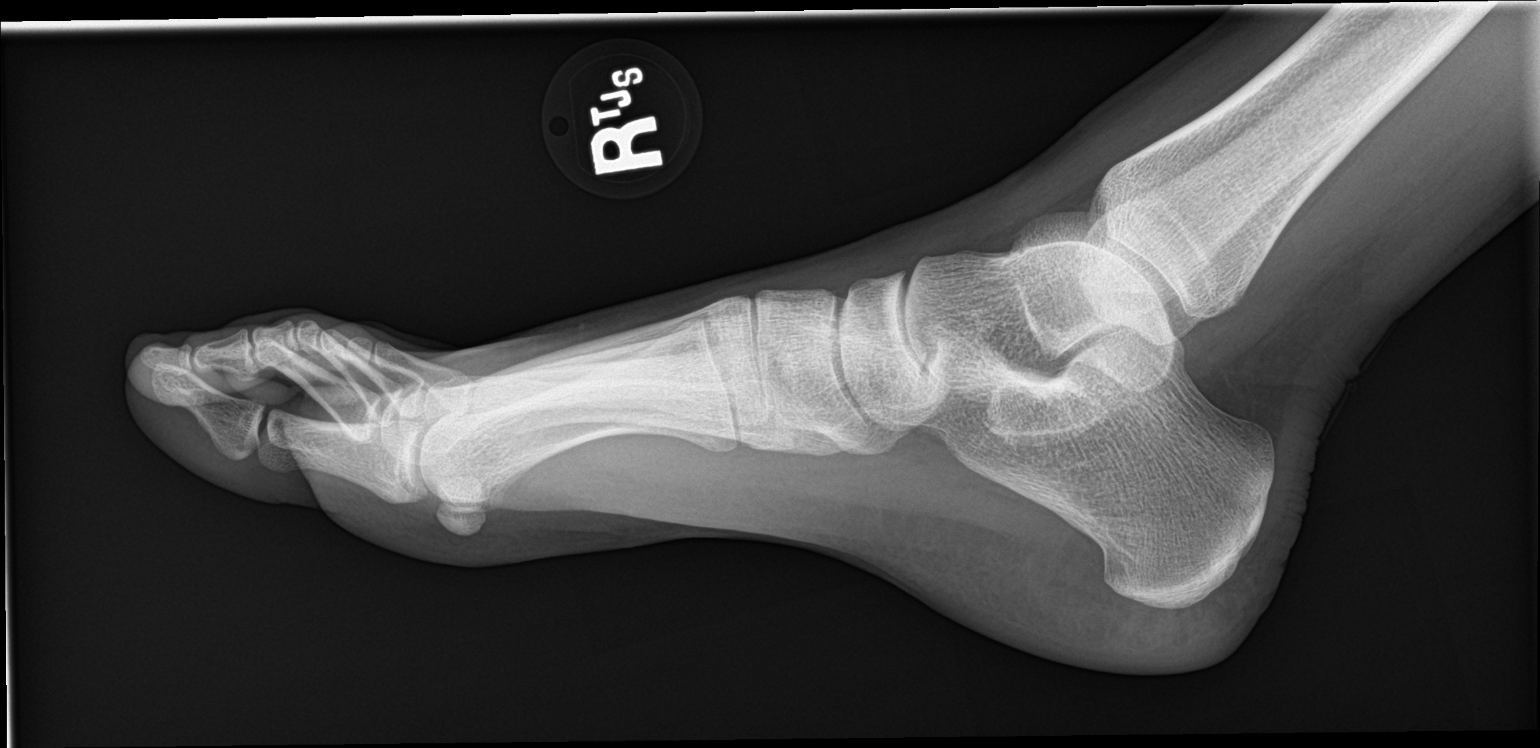

[3 of 3 positions shown; findings below may reference images not displayed]

FINDINGS: There is an avulsion fracture of the base of the fifth metatarsal.
No other acute fracture. No dislocation. The bones are well
mineralized. No arthritic changes. The soft tissues are
unremarkable.
IMPRESSION: Avulsion fracture of the base of the fifth metatarsal.
